# Patient Record
Sex: Female | Born: 1980 | Race: White | Hispanic: No | Marital: Married | Smoking: Former smoker
Health system: Southern US, Community
[De-identification: ages and names within clinical notes are randomized; demographics above are authoritative.]

---
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
## 2002-02-03 ENCOUNTER — Inpatient Hospital Stay (HOSPITAL_COMMUNITY): Admission: AD | Admit: 2002-02-03 | Discharge: 2002-02-05 | Payer: Self-pay | Admitting: Obstetrics and Gynecology

## 2002-03-19 ENCOUNTER — Other Ambulatory Visit: Admission: RE | Admit: 2002-03-19 | Discharge: 2002-03-19 | Payer: Self-pay | Admitting: Obstetrics and Gynecology

## 2004-04-12 ENCOUNTER — Inpatient Hospital Stay: Payer: Self-pay | Admitting: Obstetrics & Gynecology

## 2007-04-29 ENCOUNTER — Inpatient Hospital Stay: Payer: Self-pay

## 2009-01-30 ENCOUNTER — Inpatient Hospital Stay: Payer: Self-pay

## 2010-11-22 ENCOUNTER — Emergency Department: Payer: Self-pay | Admitting: *Deleted

## 2014-01-27 ENCOUNTER — Encounter: Payer: Self-pay | Admitting: *Deleted

## 2014-02-16 ENCOUNTER — Ambulatory Visit (INDEPENDENT_AMBULATORY_CARE_PROVIDER_SITE_OTHER): Payer: BLUE CROSS/BLUE SHIELD | Admitting: General Surgery

## 2014-02-16 ENCOUNTER — Encounter: Payer: Self-pay | Admitting: General Surgery

## 2014-02-16 VITALS — BP 118/82 | HR 74 | Resp 12 | Ht 68.0 in | Wt 146.0 lb

## 2014-02-16 DIAGNOSIS — N6002 Solitary cyst of left breast: Secondary | ICD-10-CM

## 2014-02-16 DIAGNOSIS — N6009 Solitary cyst of unspecified breast: Secondary | ICD-10-CM | POA: Insufficient documentation

## 2014-02-16 NOTE — Progress Notes (Signed)
Patient ID: Renee Frazier, female   DOB: 1980-09-22, 34 y.o.   MRN: [id number]  Chief Complaint  Patient presents with  . Other    breast mass    HPI Renee Frazier is a 34 y.o. female here today for a breast evaluation. Patient fell a left breast  mass located in her upper outer quadrant. Visually she gives the size of that of a medium to large egg. She noticed this in mid December 2015, just prior to the initiation of her menses. She was seen the day after discovery by Dr. Arvil Chaco who described plaque-like thickening in the left breast. She states the area has markedly decreased in size. No pain is associated with this area. The only known family history of breast cancer is a maternal grandmother who was diagnosed in her 87's. No injuries to the breasts.   HPI  No past medical history on file.  Past Surgical History  Procedure Laterality Date  . Wisdom tooth extraction  1999    Family History  Problem Relation Age of Onset  . Brain cancer Father   . Cancer Maternal Grandmother 36    breast     Social History History  Substance Use Topics  . Smoking status: Former Smoker -- 1.00 packs/day for 2 years    Types: Cigarettes  . Smokeless tobacco: Never Used  . Alcohol Use: 0.0 oz/week    0 Not specified per week    No Known Allergies  No current outpatient prescriptions on file.   No current facility-administered medications for this visit.    Review of Systems Review of Systems  Constitutional: Negative.   Respiratory: Negative.   Cardiovascular: Negative.     Blood pressure 118/82, pulse 74, resp. rate 12, height  (1.727 m), weight 146 lb (66.225 kg), last menstrual period 01/29/2014.  Physical Exam Physical Exam  Constitutional: She is oriented to person, place, and time. She appears well-developed and well-nourished.  Neck: Neck supple. No thyromegaly present.  Cardiovascular: Normal rate, regular rhythm and normal heart sounds.    Pulmonary/Chest: Effort normal and breath sounds normal. Right breast exhibits no inverted nipple, no mass, no nipple discharge, no skin change and no tenderness. Left breast exhibits no inverted nipple, no mass, no nipple discharge, no skin change and no tenderness.    Thickening in the upper outer quadrant of left breast.   Abdominal: Soft. Bowel sounds are normal. There is no tenderness.  Lymphadenopathy:    She has no cervical adenopathy.    She has no axillary adenopathy.  Neurological: She is alert and oriented to person, place, and time.  Skin: Skin is warm and dry.    Data Reviewed PCP notes.  Assessment    Suspected breast cyst, resolved.    Plan    Patient was encouraged to call for reassessment should be area recur or persist through her menses. Follow up otherwise will be on her regular basis with her OB/GYN. It was elected not to complete an ultrasound due to the benign clinical findings and the patient's report of massive reduction in size since initial discovery. Even if a small remnant cyst was identified aspiration would not be undertaken, and if diagnosis redundant 5 no change and follow-up plans would be undertaken.       Earline Mayotte 02/16/2014, 8:01 PM

## 2014-02-16 NOTE — Patient Instructions (Signed)
Continue self breast exams. Call office for any new breast issues or concerns. 

## 2014-02-26 ENCOUNTER — Telehealth: Payer: Self-pay | Admitting: *Deleted

## 2014-02-26 NOTE — Telephone Encounter (Signed)
Called pt for the second time about her insurance information/left message twice, pt is aware on message that if we do not get the information we need it will be self pay at time of visit.

## 2017-10-15 ENCOUNTER — Emergency Department: Payer: BLUE CROSS/BLUE SHIELD

## 2017-10-15 ENCOUNTER — Other Ambulatory Visit: Payer: Self-pay

## 2017-10-15 ENCOUNTER — Emergency Department
Admission: EM | Admit: 2017-10-15 | Discharge: 2017-10-15 | Disposition: A | Discharge status: Home / Self Care | Payer: BLUE CROSS/BLUE SHIELD | Attending: Emergency Medicine | Admitting: Emergency Medicine

## 2017-10-15 ENCOUNTER — Encounter: Payer: Self-pay | Admitting: Emergency Medicine

## 2017-10-15 DIAGNOSIS — W2209XA Striking against other stationary object, initial encounter: Secondary | ICD-10-CM | POA: Insufficient documentation

## 2017-10-15 DIAGNOSIS — Z87891 Personal history of nicotine dependence: Secondary | ICD-10-CM | POA: Diagnosis not present

## 2017-10-15 DIAGNOSIS — Y9301 Activity, walking, marching and hiking: Secondary | ICD-10-CM | POA: Insufficient documentation

## 2017-10-15 DIAGNOSIS — Y999 Unspecified external cause status: Secondary | ICD-10-CM | POA: Insufficient documentation

## 2017-10-15 DIAGNOSIS — Y929 Unspecified place or not applicable: Secondary | ICD-10-CM | POA: Diagnosis not present

## 2017-10-15 DIAGNOSIS — S91202A Unspecified open wound of left great toe with damage to nail, initial encounter: Secondary | ICD-10-CM | POA: Diagnosis not present

## 2017-10-15 DIAGNOSIS — S99922A Unspecified injury of left foot, initial encounter: Secondary | ICD-10-CM | POA: Diagnosis present

## 2017-10-15 DIAGNOSIS — S91209A Unspecified open wound of unspecified toe(s) with damage to nail, initial encounter: Secondary | ICD-10-CM

## 2017-10-15 MED ORDER — TRAMADOL HCL 50 MG PO TABS: 50.0000 mg | ORAL_TABLET | Freq: Two times a day (BID) | ORAL | 0 refills | Status: DC | PRN

## 2017-10-15 MED ORDER — LIDOCAINE HCL (PF) 1 % IJ SOLN
INTRAMUSCULAR | Status: AC
  Filled 2017-10-15: qty 5

## 2017-10-15 MED ORDER — LIDOCAINE HCL (PF) 1 % IJ SOLN
INTRAMUSCULAR | Status: AC
  Administered 2017-10-15: 5 mL
  Filled 2017-10-15: qty 5

## 2017-10-15 MED ORDER — BACITRACIN-NEOMYCIN-POLYMYXIN 400-5-5000 EX OINT
TOPICAL_OINTMENT | CUTANEOUS | Status: AC
  Filled 2017-10-15: qty 1

## 2017-10-15 MED ORDER — TRAMADOL HCL 50 MG PO TABS
50.0000 mg | ORAL_TABLET | Freq: Once | ORAL | Status: AC
  Administered 2017-10-15: 50 mg via ORAL
  Filled 2017-10-15: qty 1

## 2017-10-15 MED ORDER — LIDOCAINE HCL (PF) 1 % IJ SOLN
5.0000 mL | Freq: Once | INTRAMUSCULAR | Status: AC
  Administered 2017-10-15: 5 mL

## 2017-10-15 MED ORDER — NEOMYCIN-POLYMYXIN-PRAMOXINE 1 % EX CREA: TOPICAL_CREAM | Freq: Two times a day (BID) | CUTANEOUS | 0 refills | Status: AC

## 2017-10-15 NOTE — ED Provider Notes (Addendum)
[facility] Emergency Department Provider Note   ____________________________________________   First MD Initiated Contact with Patient 10/15/17 [phone]     (approximate)  I have reviewed the triage vital signs and the nursing notes.   HISTORY  Chief Complaint Toe Pain    HPI Renee Frazier is a 37 y.o. female patient presents with pain and partial nail avulsion to the left great toe.  Patient states she was walking up some brick steps when she stopped her left great toe.  Patient rates pain as a 4/10.  Patient described pain is "aching".  Bleeding is controlled with direct pressure.  Patient denies loss sensation or loss of function of the left great toe.  History reviewed. No pertinent past medical history.  Patient Active Problem List   Diagnosis Date Noted  . Breast cyst 02/16/2014    Past Surgical History:  Procedure Laterality Date  . WISDOM TOOTH EXTRACTION  1999    Prior to Admission medications   Medication Sig Start Date End Date Taking? Authorizing Provider  neomycin-polymyxin-pramoxine (NEOSPORIN PLUS) 1 % cream Apply topically 2 (two) times daily. 10/15/17   Joni Reining, PA-C  traMADol (ULTRAM) 50 MG tablet Take 1 tablet (50 mg total) by mouth every 12 (twelve) hours as needed. 10/15/17   Joni Reining, PA-C    Allergies Patient has no known allergies.  Family History  Problem Relation Age of Onset  . Brain cancer Father   . Cancer Maternal Grandmother 10       breast     Social History Social History   Tobacco Use  . Smoking status: Former Smoker    Packs/day: 1.00    Years: 2.00    Pack years: 2.00    Types: Cigarettes  . Smokeless tobacco: Never Used  Substance Use Topics  . Alcohol use: Yes    Alcohol/week: 0.0 standard drinks  . Drug use: No    Review of Systems Constitutional: No fever/chills Eyes: No visual changes. ENT: No sore throat. Cardiovascular: Denies chest pain. Respiratory: Denies  shortness of breath. Gastrointestinal: No abdominal pain.  No nausea, no vomiting.  No diarrhea.  No constipation. Genitourinary: Negative for dysuria. Musculoskeletal: Negative for back pain. Skin: Negative for rash.  Partial nail avulsion left great toe Neurological: Negative for headaches, focal weakness or numbness.   ____________________________________________   PHYSICAL EXAM:  VITAL SIGNS: ED Triage Vitals  Enc Vitals Group     BP 10/15/17 0821 127/77     Pulse Rate 10/15/17 0821 75     Resp 10/15/17 0821 14     Temp 10/15/17 0821 98.5 F (36.9 C)     Temp Source 10/15/17 0821 Oral     SpO2 10/15/17 0821 100 %     Weight 10/15/17 0818 155 lb (70.3 kg)     Height 10/15/17 0818 5\' 8"  (1.727 m)     Head Circumference --      Peak Flow --      Pain Score 10/15/17 0818 4     Pain Loc --      Pain Edu? --      Excl. in GC? --    Constitutional: Alert and oriented. Well appearing and in no acute distress. Cardiovascular: Normal rate, regular rhythm. Grossly normal heart sounds.  Good peripheral circulation. Respiratory: Normal respiratory effort.  No retractions. Lungs CTAB. Musculoskeletal: Left toe tenderness and edema.  No joint effusions. Neurologic:  Normal speech and language. No gross focal neurologic deficits  are appreciated. No gait instability. Skin:  Skin is warm, dry and intact. No rash noted.  Partial nail avulsion left great toe Psychiatric: Mood and affect are normal. Speech and behavior are normal.  ____________________________________________   LABS (all labs ordered are listed, but only abnormal results are displayed)  Labs Reviewed - No data to display ____________________________________________  EKG   ____________________________________________  RADIOLOGY  ED MD interpretation:    Official radiology report(s): Dg Toe Great Left  Result Date: 10/15/2017 CLINICAL DATA:  36 year old female status post great toe injury with nail avulsion  EXAM: LEFT GREAT TOE COMPARISON:  None. FINDINGS: There is no evidence of fracture or dislocation. There is no evidence of arthropathy or other focal bone abnormality. Soft tissue irregularity at the distal aspect of the great toe consistent with the clinical history of nail avulsion. IMPRESSION: Negative. Electronically Signed   By: Malachy Moan M.D.   On: 10/15/2017 09:10    ____________________________________________   PROCEDURES  Procedure(s) performed: None  .Nail Removal Date/Time: 10/15/2017 10:12 AM Performed by: Joni Reining, PA-C Authorized by: Joni Reining, PA-C   Consent:    Consent obtained:  Verbal   Consent given by:  Patient   Risks discussed:  Bleeding, incomplete removal, infection, pain and permanent nail deformity Location:    Foot:  L big toe Pre-procedure details:    Skin preparation:  Betadine Anesthesia (see MAR for exact dosages):    Anesthesia method:  Nerve block   Block needle gauge:  25 G   Block anesthetic:  Lidocaine 1% w/o epi   Block injection procedure:  Anatomic landmarks identified   Block outcome:  Anesthesia achieved Nail Removal:    Nail removed:  Partial   Nail side:  Lateral   Nail bed repaired: no     Removed nail replaced and anchored: no   Trephination:    Subungual hematoma drained: no   Ingrown nail:    Wedge excision of skin: yes     Nail matrix removed or ablated:  Partial Nails trimmed:    Number of nails trimmed:  1 Post-procedure details:    Dressing:  Antibiotic ointment and post-op shoe   Patient tolerance of procedure:  Tolerated well, no immediate complications    Critical Care performed: No  ____________________________________________   INITIAL IMPRESSION / ASSESSMENT AND PLAN / ED COURSE  As part of my medical decision making, I reviewed the following data within the electronic MEDICAL RECORD NUMBER    Partial left great toenail removal secondary to avulsion.  Discussed negative x-ray findings  with patient.  Patient given discharge care instruction advised take medication as directed.      ____________________________________________   FINAL CLINICAL IMPRESSION(S) / ED DIAGNOSES  Final diagnoses:  Nail avulsion of toe, initial encounter     ED Discharge Orders         Ordered    traMADol (ULTRAM) 50 MG tablet  Every 12 hours PRN     10/15/17 1020    neomycin-polymyxin-pramoxine (NEOSPORIN PLUS) 1 % cream  2 times daily     10/15/17 1020           Note:  This document was prepared using Dragon voice recognition software and may include unintentional dictation errors.    Joni Reining, PA-C 10/15/17 1014    Joni Reining, PA-C 10/15/17 1020    Emily Filbert, MD 10/15/17 1314

## 2017-10-15 NOTE — Discharge Instructions (Signed)
Follow discharge care instructions and wear open shoe as needed.

## 2017-10-15 NOTE — ED Notes (Signed)
See triage note  Presents with pain to left great toe  States she stubbed her toe on step  Bent back her nail  Bleeding noted

## 2017-10-15 NOTE — ED Triage Notes (Signed)
Pt stubbed toe walking up brick steps.  Scant bleeding to great toe.  Declined wheelchair.

## 2019-02-12 ENCOUNTER — Ambulatory Visit: Payer: BLUE CROSS/BLUE SHIELD | Admitting: Family Medicine

## 2019-02-18 ENCOUNTER — Ambulatory Visit: Payer: BLUE CROSS/BLUE SHIELD | Admitting: Family Medicine

## 2019-02-27 ENCOUNTER — Ambulatory Visit: Payer: Self-pay | Admitting: Family Medicine

## 2019-02-27 ENCOUNTER — Other Ambulatory Visit: Payer: Self-pay

## 2019-11-20 IMAGING — DX DG TOE GREAT 2+V*L*
3 series · 3 of 3 positions shown · non-contrast
Comparison: None.

CLINICAL DATA: 37-year-old female status post great toe injury with
nail avulsion

EXAM:
LEFT GREAT TOE

[toe ap]
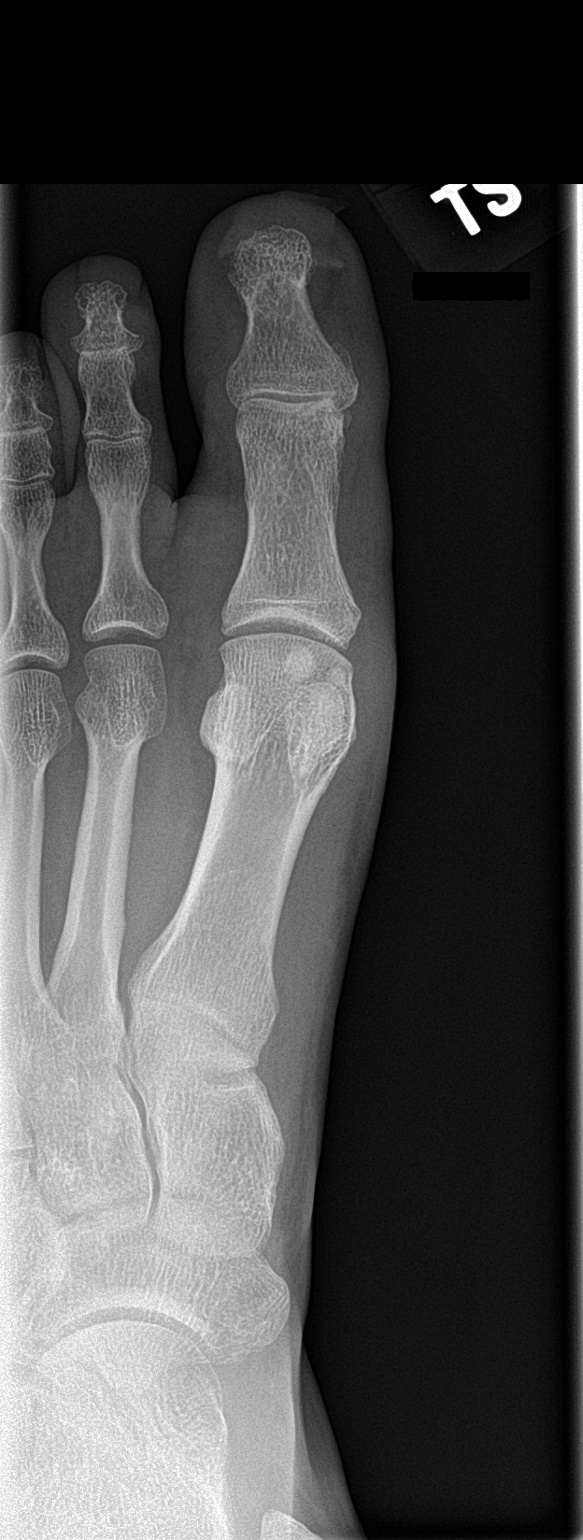

[toe obl]
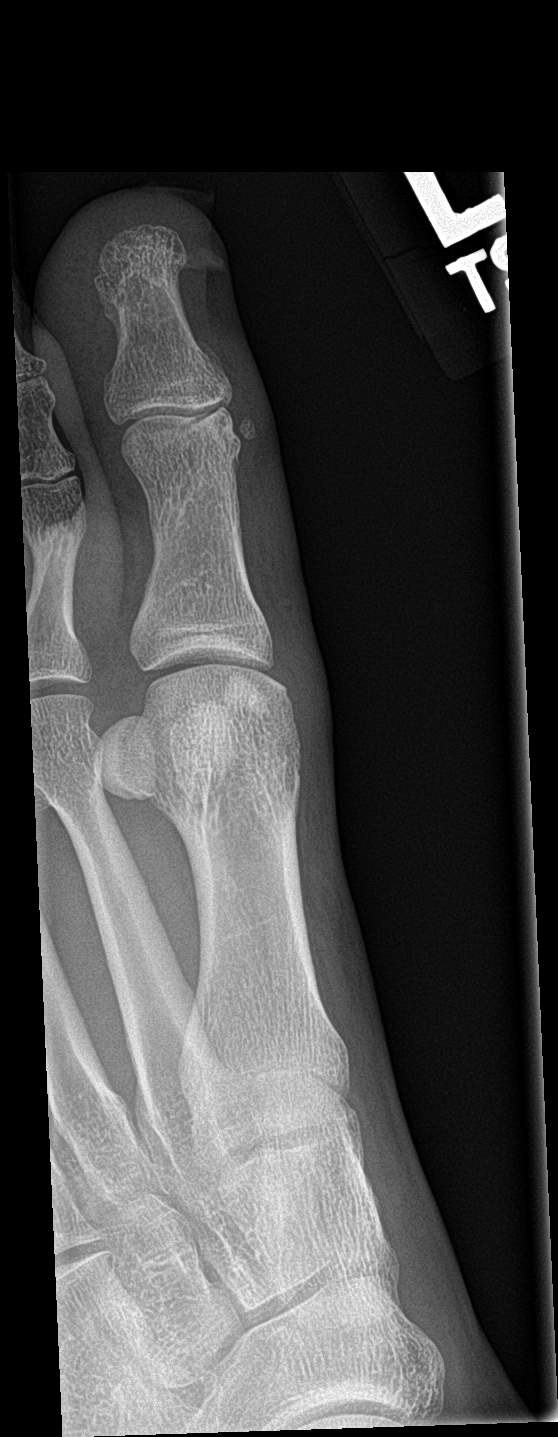

[toe lat]
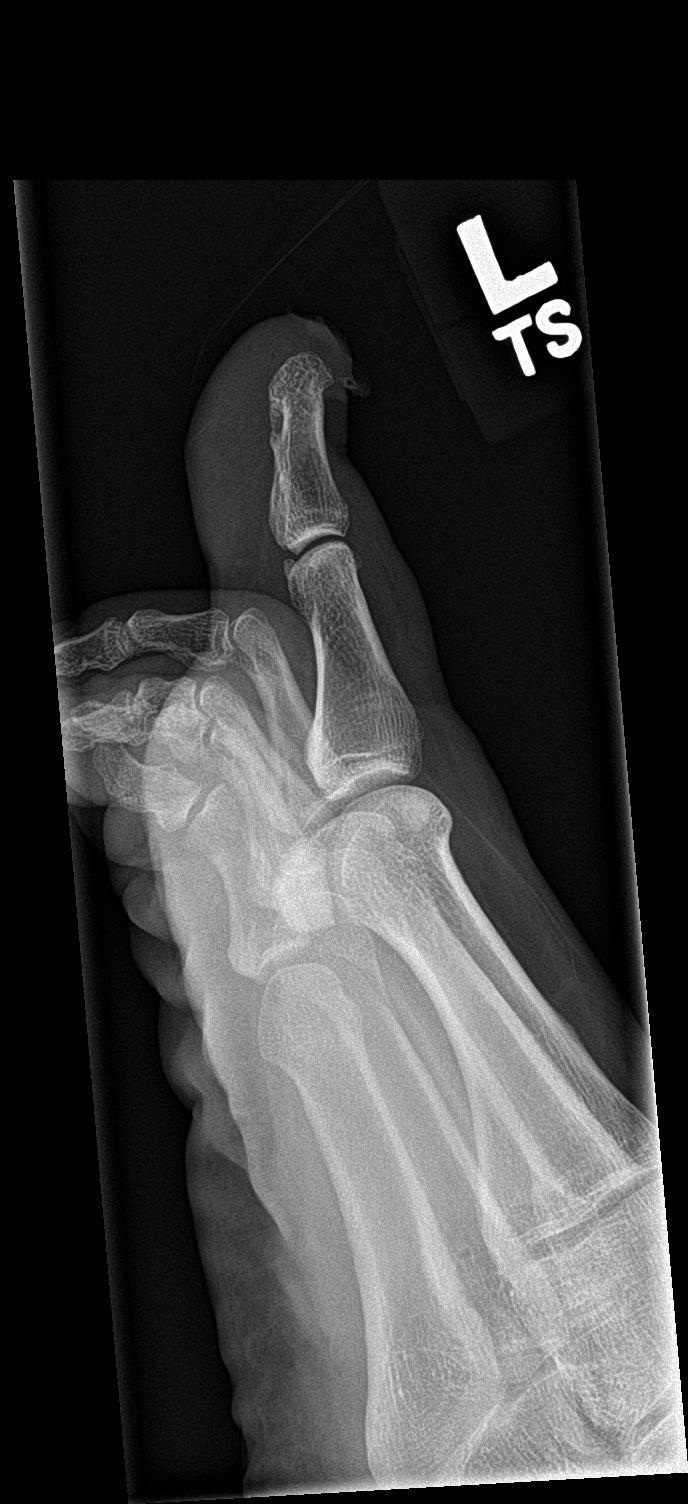

[3 of 3 positions shown; findings below may reference images not displayed]

FINDINGS: There is no evidence of fracture or dislocation. There is no
evidence of arthropathy or other focal bone abnormality. Soft tissue
irregularity at the distal aspect of the great toe consistent with
the clinical history of nail avulsion.
IMPRESSION: Negative.

## 2023-12-01 ENCOUNTER — Ambulatory Visit
Admission: EM | Admit: 2023-12-01 | Discharge: 2023-12-01 | Disposition: A | Discharge status: Home / Self Care | Payer: Self-pay | Attending: Emergency Medicine | Admitting: Emergency Medicine

## 2023-12-01 DIAGNOSIS — M545 Low back pain, unspecified: Secondary | ICD-10-CM

## 2023-12-01 LAB — POCT URINE DIPSTICK
Bilirubin, UA: NEGATIVE
Blood, UA: NEGATIVE
Glucose, UA: NEGATIVE mg/dL
Ketones, POC UA: NEGATIVE mg/dL
Nitrite, UA: NEGATIVE
Protein Ur, POC: NEGATIVE mg/dL
Spec Grav, UA: 1.015 (ref 1.010–1.025)
Urobilinogen, UA: 0.2 U/dL
pH, UA: 7.5 (ref 5.0–8.0)

## 2023-12-01 LAB — POCT URINE PREGNANCY: Preg Test, Ur: NEGATIVE

## 2023-12-01 MED ORDER — PREDNISONE 10 MG (21) PO TBPK: ORAL_TABLET | Freq: Every day | ORAL | 0 refills | Status: AC

## 2023-12-01 NOTE — ED Triage Notes (Signed)
 Patient to Urgent Care with complaints of lower back pain (tailbone/ hips). No radiation.  Reports she was lifting her dog yesterday and tweaked her back. Worse today. Hx of the same.  Taking aleve

## 2023-12-01 NOTE — Discharge Instructions (Addendum)
 Take the prednisone as directed.    Follow-up with your primary care provider or an orthopedist on Monday.

## 2023-12-01 NOTE — ED Provider Notes (Signed)
 [facility]    CSN: [id number] Arrival date & time: 12/01/23  9163      History   Chief Complaint Chief Complaint  Patient presents with   Back Pain    HPI Renee Frazier is a 43 y.o. female.  Patient presents with acute bilateral lower back pain since yesterday which started when she was picking up her dog.  The pain is nonradiating; worse with movement and ambulation.  Treatment attempted with Aleve without relief.  No numbness, weakness, paresthesias, saddle anesthesia, loss of bowel/bladder control, fever, abdominal pain, dysuria, hematuria.  Last bowel movement this morning which was normal.  Patient has history of recurrent back pain.  She requests prednisone today and states this is usually what assists her with her back pain; she reports she does not usually get relief with muscle relaxers and NSAIDs.  The history is provided by the patient and medical records.    History reviewed. No pertinent past medical history.  Patient Active Problem List   Diagnosis Date Noted   Breast cyst 02/16/2014    Past Surgical History:  Procedure Laterality Date   WISDOM TOOTH EXTRACTION  1999    OB History     Gravida  4   Para      Term      Preterm      AB      Living  2      SAB      IAB      Ectopic      Multiple      Live Births           Obstetric Comments  1st Menstrual Cycle: 11 1st Pregnancy:  21           Home Medications    Prior to Admission medications   Medication Sig Start Date End Date Taking? Authorizing Provider  predniSONE (STERAPRED UNI-PAK 21 TAB) 10 MG (21) TBPK tablet Take by mouth daily. As directed 12/01/23  Yes Corlis Burnard VEAR, NP  neomycin -polymyxin-pramoxine (NEOSPORIN PLUS) 1 % cream Apply topically 2 (two) times daily. Patient not taking: Reported on 12/01/2023 10/15/17   Claudene Tanda POUR, PA-C    Family History Family History  Problem Relation Age of Onset   Brain cancer Father    Cancer Maternal  Grandmother 49       breast     Social History Social History   Tobacco Use   Smoking status: Former    Current packs/day: 1.00    Average packs/day: 1 pack/day for 2.0 years (2.0 ttl pk-yrs)    Types: Cigarettes   Smokeless tobacco: Never  Substance Use Topics   Alcohol use: Yes    Alcohol/week: 0.0 standard drinks of alcohol   Drug use: No     Allergies   Patient has no known allergies.   Review of Systems Review of Systems  Constitutional:  Negative for chills and fever.  Gastrointestinal:  Negative for abdominal pain, blood in stool, constipation, diarrhea, nausea and vomiting.  Genitourinary:  Negative for dysuria, flank pain and hematuria.  Musculoskeletal:  Positive for back pain and gait problem. Negative for arthralgias and joint swelling.  Skin:  Negative for color change, rash and wound.  Neurological:  Negative for weakness and numbness.     Physical Exam Triage Vital Signs ED Triage Vitals  Encounter Vitals Group     BP 12/01/23 0845 139/87     Girls Systolic BP Percentile --  Girls Diastolic BP Percentile --      Boys Systolic BP Percentile --      Boys Diastolic BP Percentile --      Pulse Rate 12/01/23 0845 85     Resp 12/01/23 0845 18     Temp 12/01/23 0845 98 F (36.7 C)     Temp src --      SpO2 12/01/23 0845 99 %     Weight --      Height --      Head Circumference --      Peak Flow --      Pain Score 12/01/23 0844 7     Pain Loc --      Pain Education --      Exclude from Growth Chart --    No data found.  Updated Vital Signs BP 139/87   Pulse 85   Temp 98 F (36.7 C)   Resp 18   LMP 11/11/2023   SpO2 99%   Visual Acuity Right Eye Distance:   Left Eye Distance:   Bilateral Distance:    Right Eye Near:   Left Eye Near:    Bilateral Near:     Physical Exam Constitutional:      General: She is not in acute distress. HENT:     Mouth/Throat:     Mouth: Mucous membranes are moist.  Cardiovascular:     Rate and  Rhythm: Normal rate.  Pulmonary:     Effort: Pulmonary effort is normal. No respiratory distress.  Abdominal:     General: Bowel sounds are normal.     Palpations: Abdomen is soft.     Tenderness: There is no abdominal tenderness. There is no right CVA tenderness, left CVA tenderness, guarding or rebound.  Musculoskeletal:        General: No swelling, tenderness or deformity. Normal range of motion.  Skin:    General: Skin is warm and dry.     Capillary Refill: Capillary refill takes less than 2 seconds.     Findings: No bruising, erythema, lesion or rash.  Neurological:     General: No focal deficit present.     Mental Status: She is alert.     Sensory: No sensory deficit.     Motor: No weakness.     Gait: Gait abnormal.     Comments: Slow cautious gait, holding back with hands.      UC Treatments / Results  Labs (all labs ordered are listed, but only abnormal results are displayed) Labs Reviewed  POCT URINE DIPSTICK - Abnormal; Notable for the following components:      Result Value   Leukocytes, UA Small (1+) (*)    All other components within normal limits  POCT URINE PREGNANCY - Normal    EKG   Radiology No results found.  Procedures Procedures (including critical care time)  Medications Ordered in UC Medications - No data to display  Initial Impression / Assessment and Plan / UC Course  I have reviewed the triage vital signs and the nursing notes.  Pertinent labs & imaging results that were available during my care of the patient were reviewed by me and considered in my medical decision making (see chart for details).    Acute low back pain without sciatica.  Afebrile and vital signs are stable.  Patient has history of recurrent back pain.  Her current episode started after she picked up her dog yesterday.  She requests prednisone today as this has been  her only relief in the past.  Prednisone taper prescribed today but discussed that recurrent use of  prednisone can be problematic and should not be a long-term solution for her recurrent back pain.  Instructed her to follow-up with her PCP or an orthopedist on Monday.  Contact information for on-call Ortho provided.  Education provided on acute back pain.  She agrees to plan of care.   Final Clinical Impressions(s) / UC Diagnoses   Final diagnoses:  Acute bilateral low back pain without sciatica     Discharge Instructions      Take the prednisone as directed.    Follow-up with your primary care provider or an orthopedist on Monday.     ED Prescriptions     Medication Sig Dispense Auth. Provider   predniSONE (STERAPRED UNI-PAK 21 TAB) 10 MG (21) TBPK tablet Take by mouth daily. As directed 21 tablet Corlis Burnard DEL, NP      I have reviewed the PDMP during this encounter.   Corlis Burnard DEL, NP 12/01/23 [phone]
